# Patient Record
Sex: Male | Born: 1990 | Race: White | Hispanic: No | Marital: Single
Health system: Midwestern US, Community
[De-identification: ages and names within clinical notes are randomized; demographics above are authoritative.]

---

## 2014-06-28 ENCOUNTER — Emergency Department (HOSPITAL_COMMUNITY)
Admission: EM | Admit: 2014-06-28 | Discharge: 2014-06-28 | Disposition: A | Discharge status: Home / Self Care | Payer: Self-pay | Attending: Emergency Medicine | Admitting: Emergency Medicine

## 2014-06-28 ENCOUNTER — Encounter (HOSPITAL_COMMUNITY): Payer: Self-pay

## 2014-06-28 ENCOUNTER — Emergency Department (HOSPITAL_COMMUNITY): Payer: Self-pay

## 2014-06-28 DIAGNOSIS — W228XXA Striking against or struck by other objects, initial encounter: Secondary | ICD-10-CM | POA: Insufficient documentation

## 2014-06-28 DIAGNOSIS — F1410 Cocaine abuse, uncomplicated: Secondary | ICD-10-CM | POA: Insufficient documentation

## 2014-06-28 DIAGNOSIS — Z720 Tobacco use: Secondary | ICD-10-CM | POA: Insufficient documentation

## 2014-06-28 DIAGNOSIS — F1210 Cannabis abuse, uncomplicated: Secondary | ICD-10-CM | POA: Insufficient documentation

## 2014-06-28 DIAGNOSIS — Y990 Civilian activity done for income or pay: Secondary | ICD-10-CM | POA: Insufficient documentation

## 2014-06-28 DIAGNOSIS — S60222A Contusion of left hand, initial encounter: Secondary | ICD-10-CM | POA: Insufficient documentation

## 2014-06-28 DIAGNOSIS — F1110 Opioid abuse, uncomplicated: Secondary | ICD-10-CM | POA: Insufficient documentation

## 2014-06-28 DIAGNOSIS — Y9289 Other specified places as the place of occurrence of the external cause: Secondary | ICD-10-CM | POA: Insufficient documentation

## 2014-06-28 DIAGNOSIS — Y9389 Activity, other specified: Secondary | ICD-10-CM | POA: Insufficient documentation

## 2014-06-28 LAB — COMPREHENSIVE METABOLIC PANEL
ALT: 24 U/L (ref 0–53)
ANION GAP: 7 (ref 5–15)
AST: 24 U/L (ref 0–37)
Albumin: 4.5 g/dL (ref 3.5–5.2)
Alkaline Phosphatase: 57 U/L (ref 39–117)
BUN: 13 mg/dL (ref 6–23)
CO2: 24 mmol/L (ref 19–32)
CREATININE: 0.81 mg/dL (ref 0.50–1.35)
Calcium: 9.1 mg/dL (ref 8.4–10.5)
Chloride: 107 mmol/L (ref 96–112)
GFR calc Af Amer: >90 mL/min (ref >90)
GFR calc non Af Amer: >90 mL/min (ref >90)
Glucose, Bld: 97 mg/dL (ref 70–99)
POTASSIUM: 3.8 mmol/L (ref 3.5–5.1)
SODIUM: 138 mmol/L (ref 135–145)
Total Bilirubin: 0.8 mg/dL (ref 0.3–1.2)
Total Protein: 7.8 g/dL (ref 6.0–8.3)

## 2014-06-28 LAB — RAPID URINE DRUG SCREEN, HOSP PERFORMED
AMPHETAMINES: NOT DETECTED
BENZODIAZEPINES: NOT DETECTED
Barbiturates: NOT DETECTED
Cocaine: POSITIVE — AB
Opiates: POSITIVE — AB
TETRAHYDROCANNABINOL: POSITIVE — AB

## 2014-06-28 LAB — CBC
HCT: 41.6 % (ref 39.0–52.0)
Hemoglobin: 14.1 g/dL (ref 13.0–17.0)
MCH: 27.4 pg (ref 26.0–34.0)
MCHC: 33.9 g/dL (ref 30.0–36.0)
MCV: 80.8 fL (ref 78.0–100.0)
Platelets: 270 10*3/uL (ref 150–400)
RBC: 5.15 MIL/uL (ref 4.22–5.81)
RDW: 13.1 % (ref 11.5–15.5)
WBC: 7.1 10*3/uL (ref 4.0–10.5)

## 2014-06-28 LAB — ETHANOL: Alcohol, Ethyl (B): <5 mg/dL (ref 0–9)

## 2014-06-28 NOTE — Discharge Instructions (Signed)
Medically cleared for detox.  Hand Contusion A hand contusion is a deep bruise on your hand area. Contusions are the result of an injury that caused bleeding under the skin. The contusion may turn blue, purple, or yellow. Minor injuries will give you a painless contusion, but more severe contusions may stay painful and swollen for a few weeks. CAUSES  A contusion is usually caused by a blow, trauma, or direct force to an area of the body. SYMPTOMS   Swelling and redness of the injured area.  Discoloration of the injured area.  Tenderness and soreness of the injured area.  Pain. DIAGNOSIS  The diagnosis can be made by taking a history and performing a physical exam. An X-ray, CT scan, or MRI may be needed to determine if there were any associated injuries, such as broken bones (fractures). TREATMENT  Often, the best treatment for a hand contusion is resting, elevating, icing, and applying cold compresses to the injured area. Over-the-counter medicines may also be recommended for pain control. HOME CARE INSTRUCTIONS   Put ice on the injured area.  Put ice in a plastic bag.  Place a towel between your skin and the bag.  Leave the ice on for 15-20 minutes, 03-04 times a day.  Only take over-the-counter or prescription medicines as directed by your caregiver. Your caregiver may recommend avoiding anti-inflammatory medicines (aspirin, ibuprofen, and naproxen) for 48 hours because these medicines may increase bruising.  If told, use an elastic wrap as directed. This can help reduce swelling. You may remove the wrap for sleeping, showering, and bathing. If your fingers become numb, cold, or blue, take the wrap off and reapply it more loosely.  Elevate your hand with pillows to reduce swelling.  Avoid overusing your hand if it is painful. SEEK IMMEDIATE MEDICAL CARE IF:   You have increased redness, swelling, or pain in your hand.  Your swelling or pain is not relieved with  medicines.  You have loss of feeling in your hand or are unable to move your fingers.  Your hand turns cold or blue.  You have pain when you move your fingers.  Your hand becomes warm to the touch.  Your contusion does not improve in 2 days. MAKE SURE YOU:   Understand these instructions.  Will watch your condition.  Will get help right away if you are not doing well or get worse. Document Released: 08/10/2001 Document Revised: 11/13/2011 Document Reviewed: 08/12/2011 [facility] Patient Information 2015 [place], [place]. This information is not intended to replace advice given to you by your health care provider. Make sure you discuss any questions you have with your health care provider.  Opioid Use Disorder Opioid use disorder is a mental disorder. It is the continued nonmedical use of opioids in spite of risks to health and well-being. Misused opioids include the street drug heroin. They also include pain medicines such as morphine, hydrocodone, oxycodone, and fentanyl. Opioids are very addictive. People who misuse opioids get an exaggerated feeling of well-being. Opioid use disorder often disrupts activities at home, work, or school. It may cause mental or physical problems.  A family history of opioid use disorder puts you at higher risk of it. People with opioid use disorder often misuse other drugs or have mental illness such as depression, posttraumatic stress disorder, or antisocial personality disorder. They also are at risk of suicide and death from overdose. SIGNS AND SYMPTOMS  Signs and symptoms of opioid use disorder include:  Use of opioids in larger  amounts or over a longer period than intended.  Unsuccessful attempts to cut down or control opioid use.  A lot of time spent obtaining, using, or recovering from the effects of opioids.  A strong desire or urge to use opioids (craving).  Continued use of opioids in spite of major problems at work, school, or home because  of use.  Continued use of opioids in spite of relationship problems because of use.  Giving up or cutting down on important life activities because of opioid use.  Use of opioids over and over in situations when it is physically hazardous, such as driving a car.  Continued use of opioids in spite of a physical problem that is likely related to use. Physical problems can include:  Severe constipation.  Poor nutrition.  Infertility.  Tuberculosis.  Aspiration pneumonia.  Infections such as human immunodeficiency virus (HIV) and hepatitis (from injecting opioids).  Continued use of opioids in spite of a mental problem that is likely related to use. Mental problems can include:  Depression.  Anxiety.  Hallucinations.  Sleep problems.  Loss of sexual function.  Need to use more and more opioids to get the same effect, or lessened effect over time with use of the same amount (tolerance).  Having withdrawal symptoms when opioid use is stopped, or using opioids to reduce or avoid withdrawal symptoms. Withdrawal symptoms include:  Depressed, anxious, or irritable mood.  Nausea, vomiting, diarrhea, or intestinal cramping.  Muscle aches or spasms.  Excessive tearing or runny nose.  Dilated pupils, sweating, or hairs standing on end.  Yawning.  Fever, raised blood pressure, or fast pulse.  Restlessness or trouble sleeping. This does not apply to people taking opioids for medical reasons only. DIAGNOSIS Opioid use disorder is diagnosed by your health care provider. You may be asked questions about your opioid use and and how it affects your life. A physical exam may be done. A drug screen may be ordered. You may be referred to a mental health professional. The diagnosis of opioid use disorder requires at least two symptoms within 12 months. The type of opioid use disorder you have depends on the number of signs and symptoms you have. The type may be:  Mild. Two or three  signs and symptoms.   Moderate. Four or five signs and symptoms.   Severe. Six or more signs and symptoms. TREATMENT  Treatment is usually provided by mental health professionals with training in substance use disorders.The following options are available:  Detoxification.This is the first step in treatment for withdrawal. It is medically supervised withdrawal with the use of medicines. These medicines lessen withdrawal symptoms. They also raise the chance of becoming opioid free.  Counseling, also known as talk therapy. Talk therapy addresses the reasons you use opioids. It also addresses ways to keep you from using again (relapse). The goals of talk therapy are to avoid relapse by:  Identifying and avoiding triggers for use.  Finding healthy ways to cope with stress.  Learning how to handle cravings.  Support groups. Support groups provide emotional support, advice, and guidance.  A medicine that blocks opioid receptors in your brain. This medicine can reduce opioid cravings that lead to relapse. This medicine also blocks the desired opioid effect when relapse occurs.  Opioids that are taken by mouth in place of the misused opioid (opioid maintenance treatment). These medicines satisfy cravings but are safer than commonly misused opioids. This often is the best option for people who continue to relapse with  other treatments. HOME CARE INSTRUCTIONS   Take medicines only as directed by your health care provider.  Check with your health care provider before starting new medicines.  Keep all follow-up visits as directed by your health care provider. SEEK MEDICAL CARE IF:  You are not able to take your medicines as directed.  Your symptoms get worse. SEEK IMMEDIATE MEDICAL CARE IF:  You have serious thoughts about hurting yourself or others.  You may have taken an overdose of opioids. FOR MORE INFORMATION  National Institute on Drug Abuse: http://www.price-smith.com/www.drugabuse.gov  Substance  Abuse and Mental Health Services Administration: [employer].com.ptwww.samhsa.gov Document Released: 12/16/2006 Document Revised: 07/05/2013 Document Reviewed: 03/03/2013 [facility] Patient Information 2015 [place]. This information is not intended to replace advice given to you by your health care provider. Make sure you discuss any questions you have with your health care provider.

## 2014-06-28 NOTE — ED Notes (Signed)
Pt ambulating independently w/ steady gait on d/c in no acute distress, A&Ox4. D/c instructions reviewed w/ pt - pt denies any further questions or concerns at present.  

## 2014-06-28 NOTE — ED Provider Notes (Signed)
CSN: [id number]     Arrival date & time 06/28/14  1637 History   This chart was scribed for non-physician practitioner, Celene Skeenobyn Quantel Mcinturff, PA-C, working with Mancel BaleElliott Wentz, MD by Charline BillsEssence Howell, ED Scribe. This patient was seen in room WTR3/WLPT3 and the patient's care was started at 5:09 PM.   Chief Complaint  Patient presents with  . Detox    The history is provided by the patient. No language interpreter was used.   HPI Comments: Jonathan Rocha is a 24 y.o. male who presents to the Emergency Department for opiate detox. Pt has been using opiates and cocaine for the past 1-2 years. His last use was last night. [facility] and was sent here for medical clearance.   Pt also presents with L hand pain since yesterday. He states that he hit his hand while at work yesterday. Pt reports a small abrasion to the area and associated redness.   History reviewed. No pertinent past medical history. History reviewed. No pertinent past surgical history. History reviewed. No pertinent family history. History  Substance Use Topics  . Smoking status: Current Every Day Smoker -- 0.50 packs/day    Types: Cigarettes  . Smokeless tobacco: Not on file  . Alcohol Use: No    Review of Systems  Constitutional: Negative.   Respiratory: Negative.   Cardiovascular: Negative.   Musculoskeletal: Positive for arthralgias.  Neurological: Negative.    Allergies  Review of patient's allergies indicates no known allergies.  Home Medications   Prior to Admission medications   Not on File   BP 127/82 mmHg  Pulse 80  Temp(Src) 97.9 F (36.6 C) (Oral)  Resp 16  SpO2 98% Physical Exam  Constitutional: He is oriented to person, place, and time. He appears well-developed and well-nourished. No distress.  HENT:  Head: Normocephalic and atraumatic.  Eyes: Conjunctivae and EOM are normal.  Neck: Normal range of motion. Neck supple.  Cardiovascular: Normal rate, regular  rhythm and normal heart sounds.   Pulmonary/Chest: Effort normal and breath sounds normal.  Musculoskeletal: Normal range of motion.  Left hand TTP over carpal bones with swelling. For range of motion of hand, wrist and digits. +2 radial pulse. Cap refill less than 3 seconds.  Neurological: He is alert and oriented to person, place, and time.  Skin: Skin is warm and dry.  Psychiatric: He has a normal mood and affect. His behavior is normal.  Nursing note and vitals reviewed.  ED Course  Procedures (including critical care time) DIAGNOSTIC STUDIES: Oxygen Saturation is 98% on RA, normal by my interpretation.    COORDINATION OF CARE: 5:11 PM-Discussed treatment plan which includes XR with pt at bedside and pt agreed to plan.   Labs Review Labs Reviewed  CBC  COMPREHENSIVE METABOLIC PANEL  ETHANOL  URINE RAPID DRUG SCREEN (HOSP PERFORMED)   Imaging Review Dg Hand Complete Left  06/28/2014   CLINICAL DATA:  Left hand trauma, injury, metacarpal pain  EXAM: LEFT HAND - COMPLETE 3+ VIEW  COMPARISON:  None.  FINDINGS: Mild left hand soft tissue swelling dorsally over the metacarpals and wrist. Normal alignment. No acute osseous finding or fracture. Preserved joint spaces. No significant arthropathy. No radiopaque foreign body.  IMPRESSION: Dorsal soft tissue swelling.  No other acute osseous finding.   Electronically Signed   By: Judie PetitM.  Shick M.D.   On: 06/28/2014 18:16     EKG Interpretation None      MDM   Final  diagnoses:  Hand contusion, left, initial encounter  Opioid abuse   Nontoxic appearing, NAD. AF VSS. Neurovascularly intact. Hand x-ray with results as stated above. Advised ice and elevation. Already has a place that he has been accepted for detox as stated above. They are requesting medical clearance and will not accept him without labs. Labs obtained, and he is medically cleared. Stable for discharge. Return precautions given. Patient states understanding of treatment care  plan and is agreeable.  I personally performed the services described in this documentation, which was scribed in my presence. The recorded information has been reviewed and is accurate.  Kathrynn Speed, PA-C 06/28/14 1954  Mancel Bale, MD 06/29/14 [phone]

## 2014-06-28 NOTE — ED Notes (Signed)
Pt presents for detox from heroin and cocaine.  Pt reports last use was yesterday.  Sts "I'm entering a 2 year program and they told me that I need to come here and detox."  Denies SI/HIAV.

## 2015-11-01 IMAGING — CR DG HAND COMPLETE 3+V*L*
3 series · 3 of 3 positions shown · non-contrast
Comparison: None.

CLINICAL DATA: Left hand trauma, injury, metacarpal pain

EXAM:
LEFT HAND - COMPLETE 3+ VIEW

[x hand pa left]
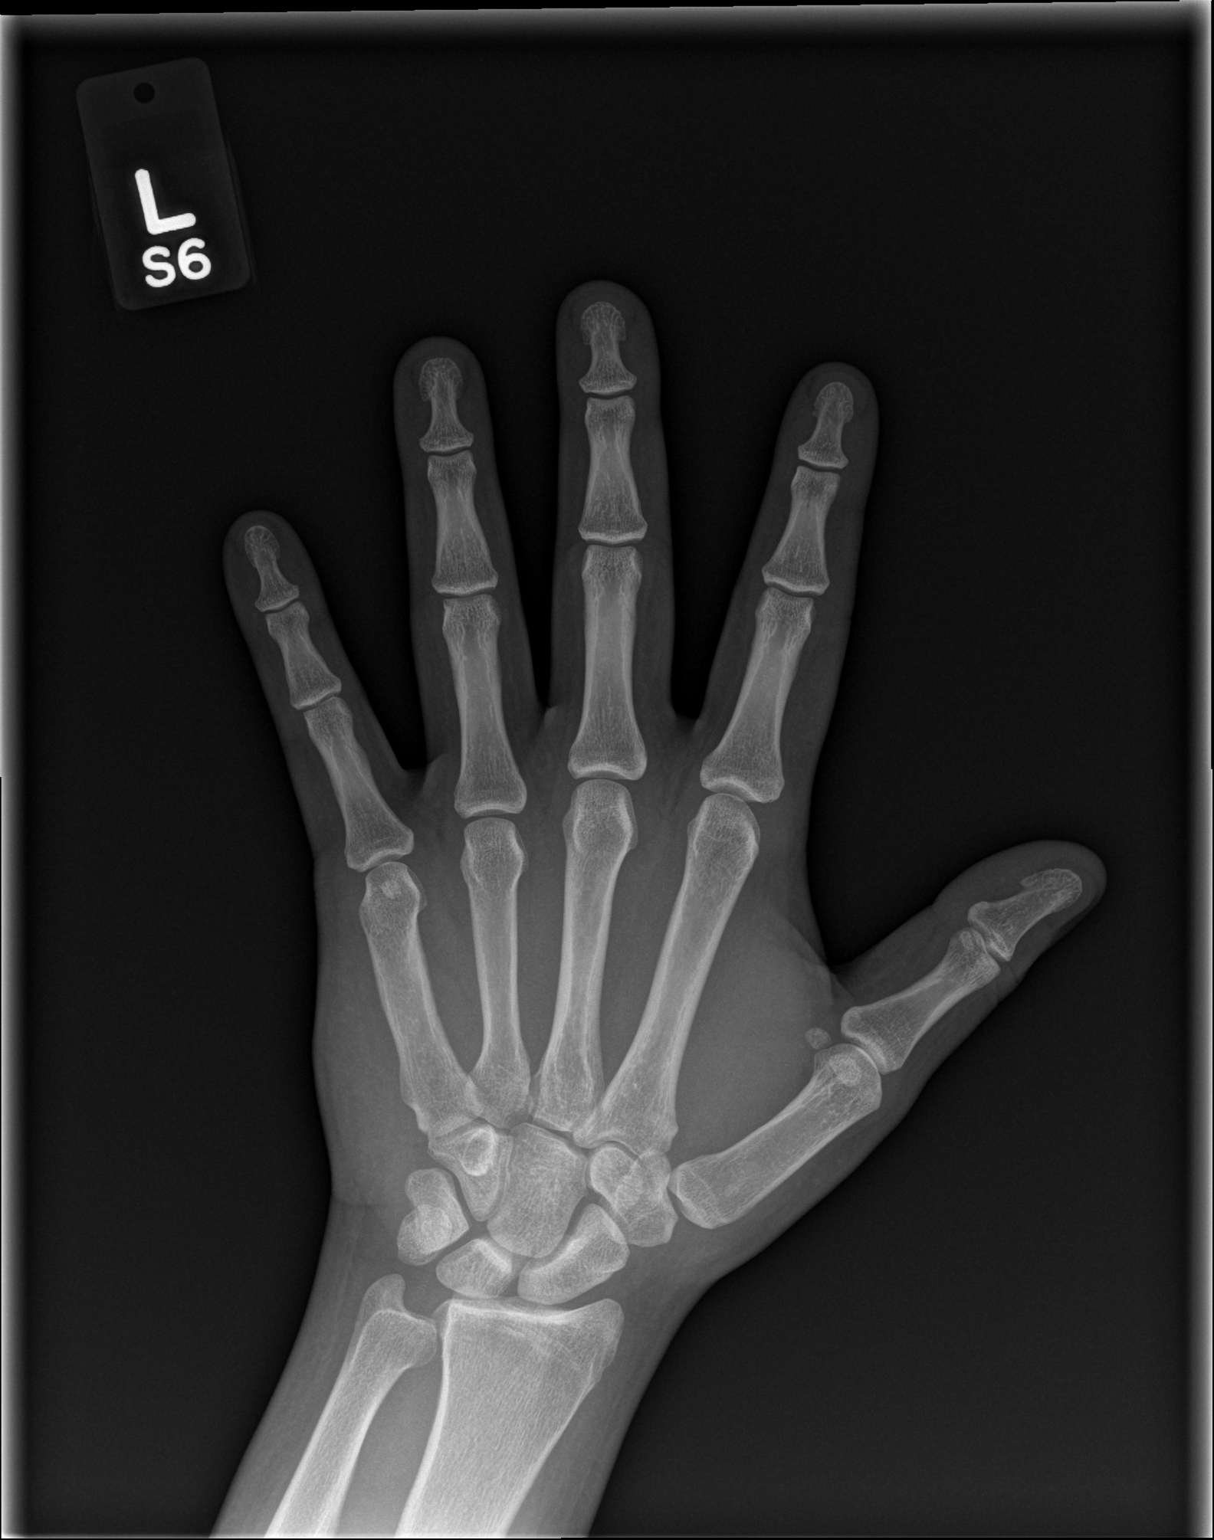

[x hand obl left]
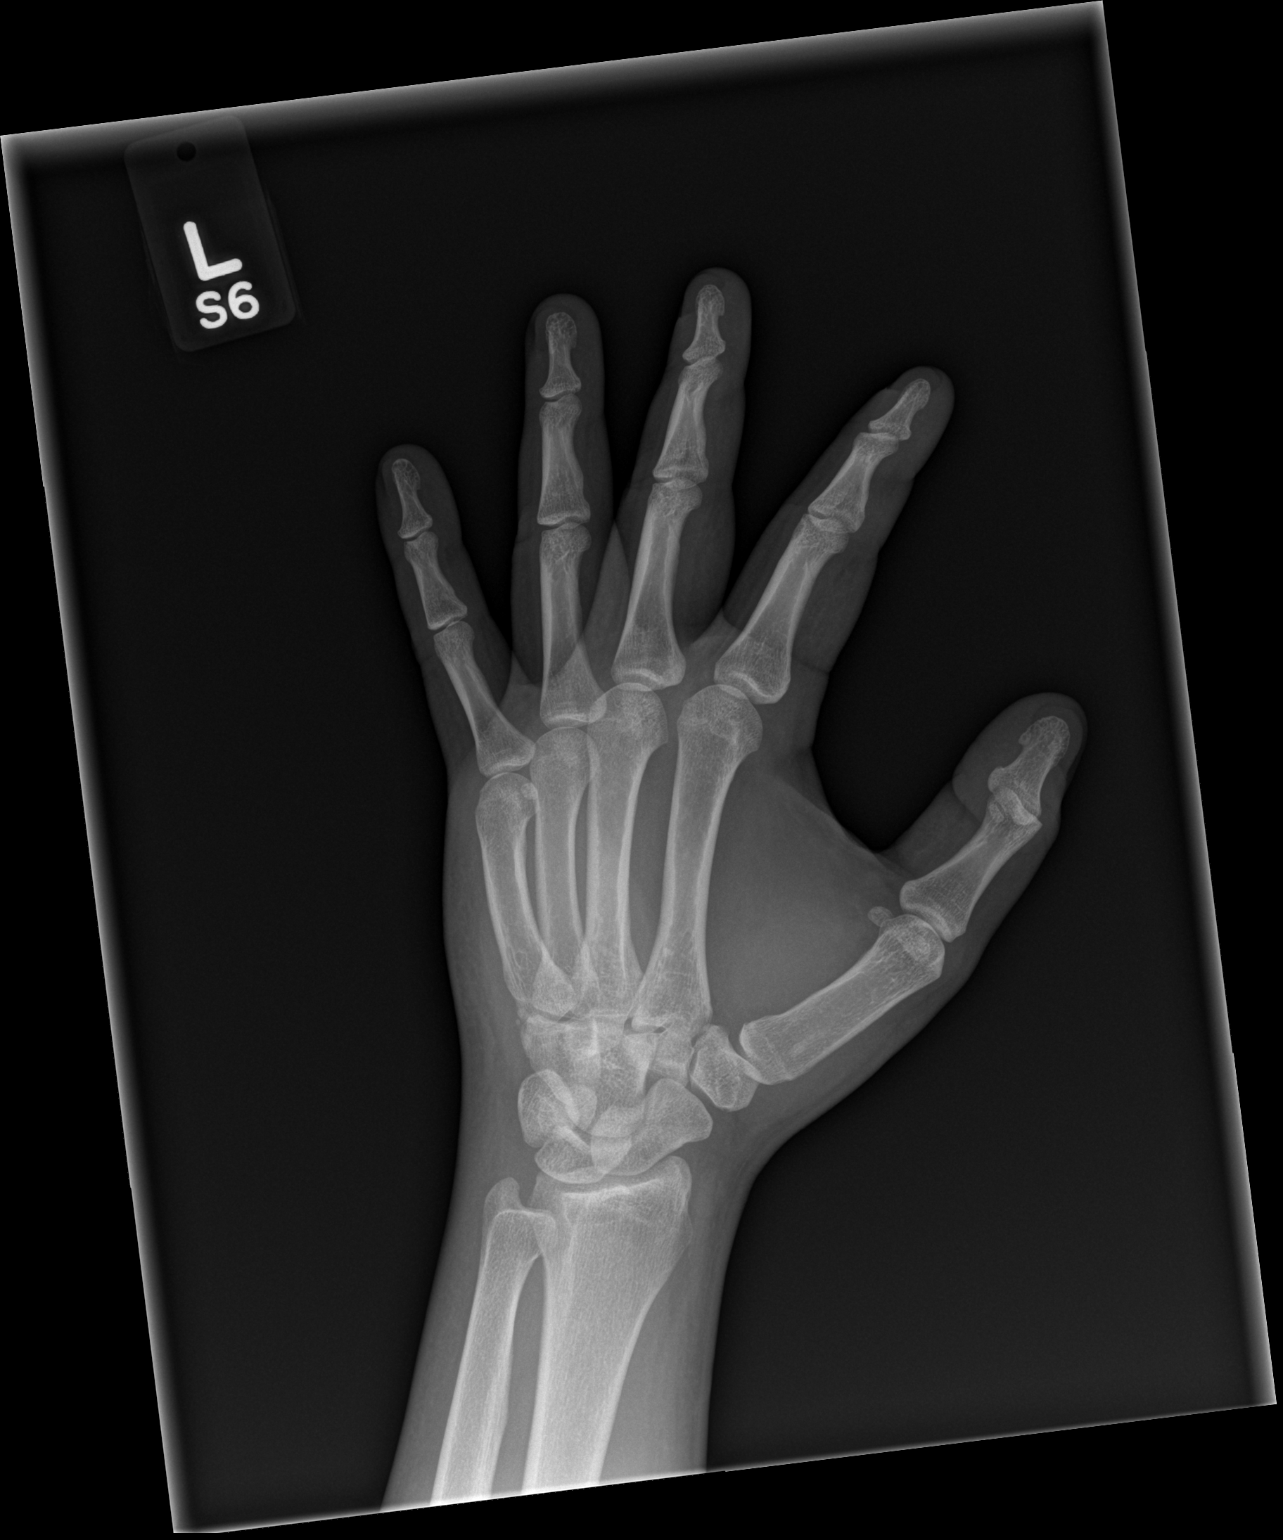

[x hand lat left]
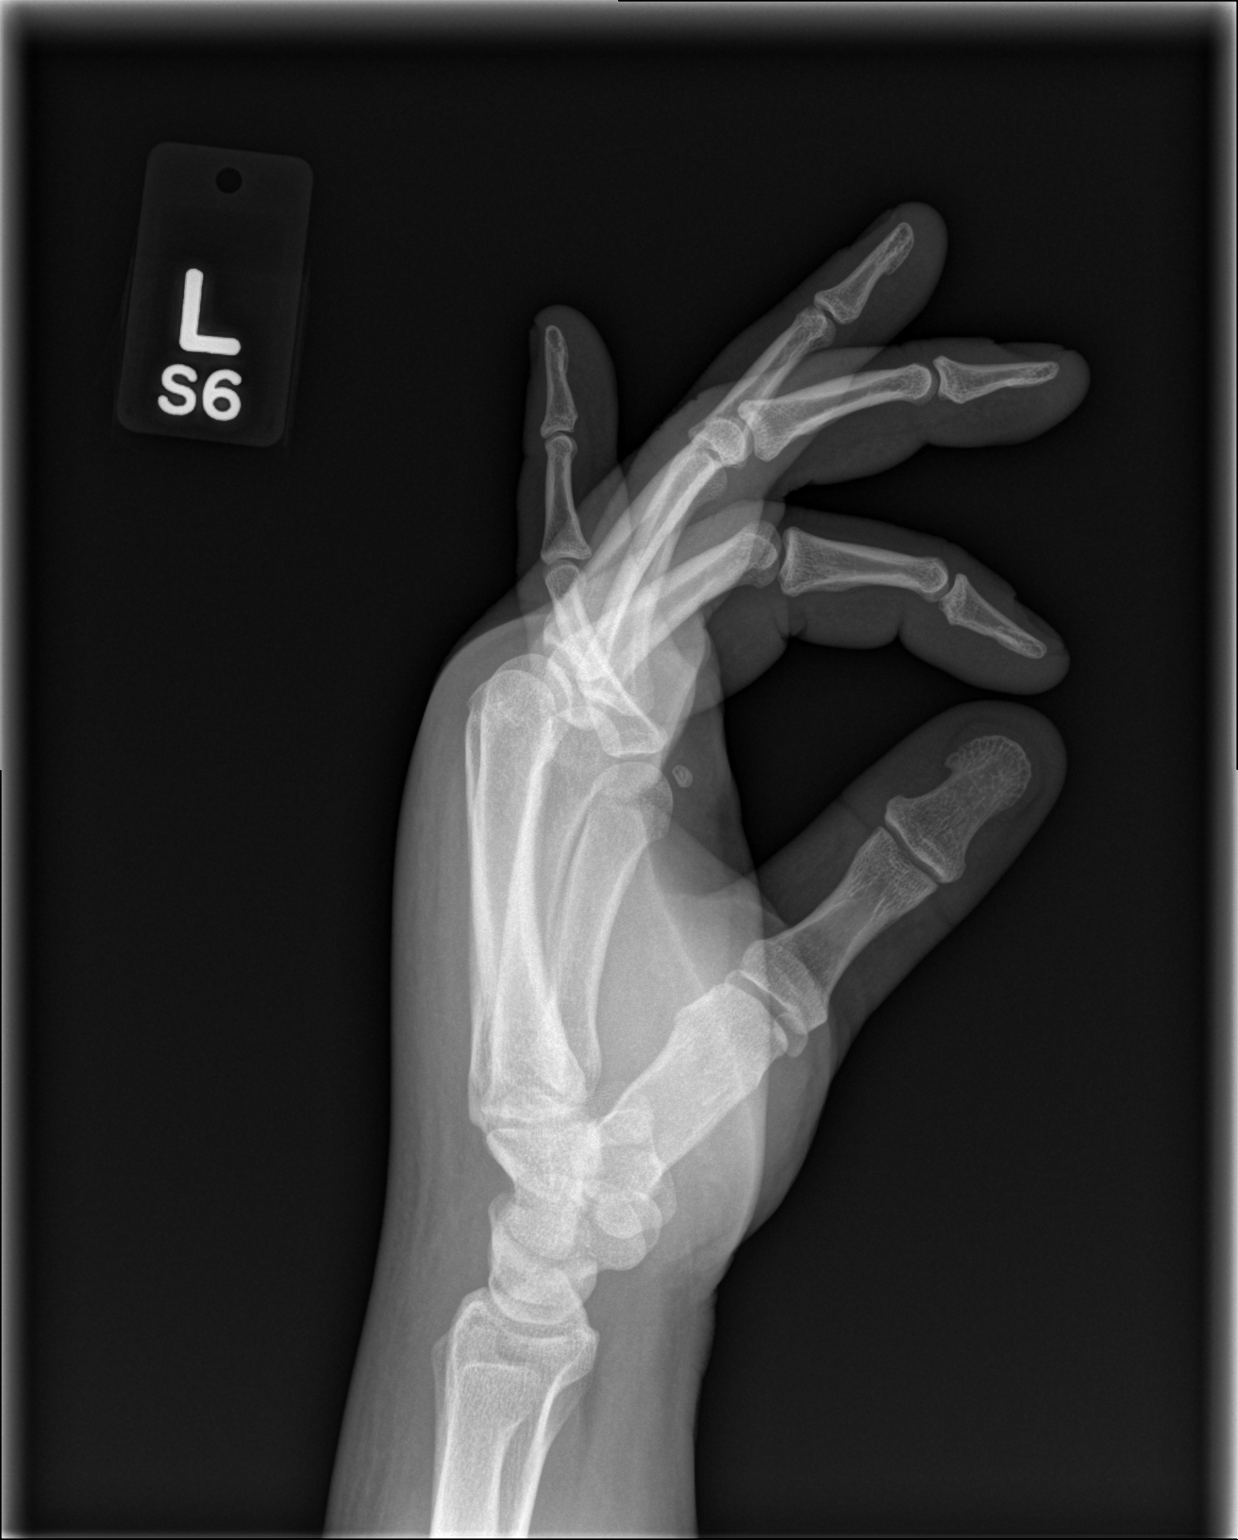

[3 of 3 positions shown; findings below may reference images not displayed]

FINDINGS: Mild left hand soft tissue swelling dorsally over the metacarpals
and wrist. Normal alignment. No acute osseous finding or fracture.
Preserved joint spaces. No significant arthropathy. No radiopaque
foreign body.
IMPRESSION: Dorsal soft tissue swelling.  No other acute osseous finding.

## 2019-09-30 NOTE — ED Provider Notes (Signed)
Formatting of this note is different from the original.  [facility] Emergency Department     No chief complaint on file.    29 yo M who stated that he did some dope tonight and "I guess it messed me up."  EMS was dispatched to the home because patient was found unresponsive in a bathroom.  Narcan 4 mg IN was administered by bystander resulting in the patient being awake when they arrived.  His vital signs were normal but his blood sugar was 240 mg%. The patient stated that he does not know what he snorted.  He is not on any routine medications.     Allergies: Patient has no known allergies.    Current Meds:   No current facility-administered medications for this encounter.     Current Outpatient Medications   Medication Sig Dispense Refill   ? buprenorphine-naloxone (Suboxone) 8-2 mg Sublingual Film Take 0.25 Film under tongue daily.     ? naloxone 4 mg/actuation Nasal Spray, Non-Aerosol by Nasal route as directed. 1 Each 0     Past Medical History:   Diagnosis Date   ? NEGATIVE HISTORY OF      Past Surgical History:   Procedure Laterality Date   ? SUR - OTHER: SPECIFY IN COMMENTS  none     Social History     Tobacco Use   ? Smoking status: Current Every Day Smoker     Packs/day: 0.50     Years: 3.00     Pack years: 1.50     Types: Cigarettes   ? Smokeless tobacco: Never Used   Substance Use Topics   ? Alcohol use: No     Social History     Substance and Sexual Activity   Drug Use No     No family history on file.    Review of Systems   Constitutional: Negative for chills and fever.   HENT: Negative for ear pain and sore throat.         Funny taste in back of throat after narcan   Eyes: Negative for pain and visual disturbance.   Respiratory: Negative for cough and shortness of breath.    Cardiovascular: Negative for chest pain and palpitations.   Gastrointestinal: Negative for abdominal pain, nausea and vomiting.   Genitourinary: Negative for difficulty urinating.   Musculoskeletal: Negative for  arthralgias and back pain.   Skin: Negative for color change and rash.   Neurological: Negative for seizures.   Psychiatric/Behavioral: Negative for self-injury. The patient is nervous/anxious and is hyperactive.    All other systems reviewed and are negative.    Recent VS:     I performed a physical exam    Patient weight not recorded    Physical Exam  Vitals and nursing note reviewed.   Constitutional:       Comments: Wearing home monitoring ankle bracelet   HENT:      Head: Normocephalic and atraumatic.      Nose: Nose normal.      Mouth/Throat:      Mouth: Mucous membranes are dry.      Pharynx: Oropharynx is clear.   Eyes:      Extraocular Movements: Extraocular movements intact.      Conjunctiva/sclera: Conjunctivae normal.      Pupils: Pupils are equal, round, and reactive to light.   Cardiovascular:      Rate and Rhythm: Normal rate and regular rhythm.      Pulses: Normal pulses.  Heart sounds: Normal heart sounds.   Pulmonary:      Effort: Pulmonary effort is normal.      Breath sounds: Normal breath sounds.   Abdominal:      General: Bowel sounds are normal. There is no distension.      Palpations: Abdomen is soft.      Tenderness: There is no abdominal tenderness. There is no guarding.   Musculoskeletal:         General: No swelling or tenderness. Normal range of motion.      Cervical back: Normal range of motion and neck supple.      Right lower leg: No edema.      Left lower leg: No edema.   Skin:     General: Skin is warm and dry.   Neurological:      General: No focal deficit present.      Mental Status: He is oriented to person, place, and time.   Psychiatric:         Attention and Perception: Perception normal.         Mood and Affect: Mood is anxious.         Speech: Speech normal.         Behavior: Behavior is hyperactive.         Cognition and Memory: Cognition and memory normal.         Judgment: Judgment is impulsive.      Comments: Patient "fidgity" and talking over me during examination.        MDM  Number of Diagnoses or Management Options  Diagnosis management comments: 29 yo M presents after apparent opioid overdose which was reversed in the field by narcan IN.  His examination is reassuring.  Will check basic labs considering elevated FSBS by EMS to possibly identify any contributory metabolic abnormality. Patient will be observed in the ED to ensure sobriety     Results for orders placed or performed during the hospital encounter of 09/30/19 (from the past 24 hour(s))   (PANEL)-CBC WITH DIFFERENTIAL *Canceled*    Narrative    The following orders were created for panel order (PANEL)-CBC WITH DIFFERENTIAL.  Procedure                               Abnormality         Status                     ---------                               -----------         ------                       Please view results for these tests on the individual orders.   COMPREHENSIVE METABOLIC PANEL   Test Value Low-High    Sodium 134 (L) 136 - 145 mEq/L    Potassium 4.6 (H) 3.5 - 4.5 mEq/L    Chloride 103 98 - 107 mEq/L    CO2 19 (L) 22 - 29 mEq/L    Calcium 9.2 8.4 - 10.2 mg/dL    Glucose 161 (H) 70 - 105 mg/dL    BUN 13 9 - 21 mg/dL    Creatinine 0.96 0.45 - 1.25 mg/dL    Glomerular Filtration Rate 120 >=59  mL/Min/1.73 m2    Protein, Total 7.5 6.4 - 8.3 g/dL    Albumin 3.9 3.5 - 5.2 g/dL    Bilirubin, Total 1.7 (H) 0.1 - 1.2 mg/dL    Alk Phosphatase 85 40 - 150 U/L    SGOT (AST) 450 (H) 5 - 34 U/L    SGPT (ALT) 446 (H) 0 - 55 U/L    Anion Gap 12 4 - 12 mEq/L    ZOX:WRUEA Ratio 16.46     Osmo (Calc'd) 280 mOsm/kg    Globulin (Calc'd) 3.6 g/dL    A:G Ratio 5.40    LIPASE   Test Value Low-High    Lipase 14 8 - 78 U/L   MAGNESIUM   Test Value Low-High    Magnesium 2.0 1.6 - 2.6 mg/dL   (PANEL)-URINALYSIS, COMPLETE    Narrative    The following orders were created for panel order (PANEL)-URINALYSIS, COMPLETE.  Procedure                               Abnormality         Status                     ---------                                -----------         ------                     URINALYSIS, COMPLETE[[id number]]         Abnormal            Final result                 Please view results for these tests on the individual orders.   DRUG SCREEN, URINE   Test Value Low-High    BARBITURATES SCREEN Below Detection Limit Below Detection Limit    BENZODIAZEPINE SCREEN Below Detection Limit Below Detection Limit    COCAINE SCREEN Above Detection Limit (A) Below Detection Limit    OPIATE SCREEN Below Detection Limit Below Detection Limit    AMPHETAMINE SCREEN Above Detection Limit (A) Below Detection Limit    CANNABINOID SCREEN Below Detection Limit Below Detection Limit    PHENCYCLIDINE SCREEN Below Detection Limit Below Detection Limit    MDMA (ECSTASY) SCREEN Below Detection Limit Below Detection Limit    Methadone Screen Below Detection Limit Below Detection Limit    Narrative    This is an unconfirmed screening result. Cross reactions with other drugs may occur. Consult lab for details. Unconfirmed screening results should not be used for non-medical purposes.    URINALYSIS, COMPLETE   Test Value Low-High    Color, Urine Yellow Colorless, Yellow, or Straw    Clarity, Urine Clear Clear    Specific Gravity, Urine >=1.030 1.000 - 1.050    pH, Urine 5.5 5.0 - 8.0    Hemoglobin, Urine Negative Negative    Bilirubin, Urine Negative Negative    Urobilinogen, Urine Normal Normal    Ketones, Urine Negative Negative    Glucose, Urine Trace (A) Negative    Nitrites, Urine Negative Negative    Leukocyte Esterase, Urine Negative Negative    Protein, Urine 2+ (A) Negative    Narrative    Microscopic examination not indicated.     EKG (Interpretation if  ordered):     MEDICAL DECISION MAKING/RESULTS/PLAN    I have reviewed the past medical, family and social history sections: including the medications, nurses notes, vital signs, and allergies listed in the above record.    Emergency Department Course:     Informed that the patient feels back to his usual and  does not want any further ED evaluation. He does not want to remain pending his lab results.  He has left the ED prior to my speaking with him.                Clinical Impressions as of Sep 29 1217   Opioid overdose, accidental or unintentional, initial encounter (CMS/HCC)     ED Provider: Wyatt Mage, MD    09/30/2019      Electronically signed by Wyatt Mage, MD at 09/30/2019 12:21 PM EDT

## 2020-02-06 DIAGNOSIS — S61210A Laceration without foreign body of right index finger without damage to nail, initial encounter: Secondary | ICD-10-CM

## 2020-02-06 NOTE — ED Notes (Signed)
Patient cut his finger around 8 am with a pocket knife

## 2020-02-07 ENCOUNTER — Inpatient Hospital Stay
Admit: 2020-02-07 | Discharge: 2020-02-07 | Disposition: A | Discharge status: Home / Self Care | Payer: BLUE CROSS/BLUE SHIELD | Attending: Emergency Medicine

## 2020-02-07 MED ORDER — MUPIROCIN 2 % OINTMENT
2 % | Freq: Every day | CUTANEOUS | Status: DC
Start: 2020-02-07 — End: 2020-02-07
  Administered 2020-02-07: 05:00:00 via TOPICAL

## 2020-02-07 MED ORDER — CEPHALEXIN 500 MG CAP
500 mg | ORAL_CAPSULE | Freq: Three times a day (TID) | ORAL | 0 refills | Status: AC
Start: 2020-02-07 — End: 2020-02-12

## 2020-02-07 MED ORDER — TETANUS-DIPHTHERIA TOXOIDS-TD 2 LF UNIT-2 LF UNIT/0.5 ML IM SUSP
2-2 Lf unit/0.5 mL | Freq: Once | INTRAMUSCULAR | Status: AC
Start: 2020-02-07 — End: 2020-02-07
  Administered 2020-02-07: 05:00:00 via INTRAMUSCULAR

## 2020-02-07 MED ORDER — MUPIROCIN 2 % OINTMENT
2 % | Freq: Every day | CUTANEOUS | Status: CN
Start: 2020-02-07 — End: 2020-02-07

## 2020-02-07 MED FILL — TDVAX 2 LF UNIT-2 LF UNIT/0.5 ML INTRAMUSCULAR SUSPENSION: 2-2 Lf unit/0.5 mL | INTRAMUSCULAR | Qty: 0.5

## 2020-02-07 MED FILL — MUPIROCIN 2 % OINTMENT: 2 % | CUTANEOUS | Qty: 22

## 2020-02-07 NOTE — ED Notes (Signed)
I have reviewed discharge instructions with the PATIENT.  The PATIENT  verbalized understanding.Discussed with the patient and all questioned fully answered.

## 2020-02-07 NOTE — ED Provider Notes (Signed)
EMERGENCY DEPARTMENT HISTORY AND PHYSICAL EXAM  ?    Date: 02/06/2020  Patient Name: Ethan Farley    History of Presenting Illness    Patient presents with:  Laceration: right index finger       History Provided By: Patient    HPI: Ethan Farley, 29 y.o. male with no significant past medical history presents to the ED with cc of right index finger laceration with a knife.  Injury was accidental.  It occurred over 12 hours ago.    There are no other complaints, changes, or physical findings at this time.    PCP: None    No current facility-administered medications on file prior to encounter.  Current Outpatient Medications on File Prior to Encounter:  buprenorphine-naloxone (SUBOXONE) 8-2 mg film sublingaul film, 0.25 Film by SubLINGual route daily., Disp: , Rfl:         Past History    Past Medical History:  History reviewed. No pertinent past medical history.    Past Surgical History:  History reviewed. No pertinent surgical history.    Family History:  History reviewed.  No pertinent family history.      Social History:  Social History   Tobacco Use     Smoking status: Current Every Day Smoker       Packs/day: 0.50     Smokeless tobacco: Never Used   Alcohol use: Not Currently   Drug use: Not Currently      Allergies:  No Known Allergies      Review of Systems  @ROSBYAGE @    Physical Exam  @PHYEXAMBYAGE @    Diagnostic Study Results    Labs -  No results found for this or any previous visit (from the past 12 hour(s)).    Radiologic Studies -   No orders to display  CT Results  (Last 48 hours)   None     CXR Results  (Last 48 hours)   None         Medical Decision Making  I am the first provider for this patient.    I reviewed the vital signs, available nursing notes, past medical history, past surgical history, family history and social history.    Vital Signs-Reviewed the patient's vital signs.  Empty flowsheet group.      Records Reviewed: Nursing Notes    Provider Notes (Medical Decision Making):   Seeking  repair wound is delayed.  Hand is also dirty.  Therefore wound was not closed.    ED Course:   Patient was updated on tetanus.  Will cover with Keflex.    Initial assessment performed. The patients presenting problems have been discussed, and they are in agreement with the care plan formulated and outlined with them.  I have encouraged them to ask questions as they arise throughout their visit.              PLAN:  1. Discharge Medication List as of 02/07/2020 12:19 AM      2. Follow-up Information    None    Return to ED if worse     Diagnosis    Clinical Impression: Laceration of right index finger without foreign body without damage to nail, initial encounter  (primary encounter diagnosis)                 History reviewed. No pertinent past medical history.    History reviewed. No pertinent surgical history.      History reviewed. No pertinent family history.  Social History     Socioeconomic History   ??? Marital status: SINGLE     Spouse name: Not on file   ??? Number of children: Not on file   ??? Years of education: Not on file   ??? Highest education level: Not on file   Occupational History   ??? Not on file   Tobacco Use   ??? Smoking status: Current Every Day Smoker     Packs/day: 0.50   ??? Smokeless tobacco: Never Used   Substance and Sexual Activity   ??? Alcohol use: Not Currently   ??? Drug use: Not Currently   ??? Sexual activity: Yes     Partners: Female   Other Topics Concern   ??? Not on file   Social History Narrative   ??? Not on file     Social Determinants of Health     Financial Resource Strain:    ??? Difficulty of Paying Living Expenses: Not on file   Food Insecurity:    ??? Worried About Running Out of Food in the Last Year: Not on file   ??? Ran Out of Food in the Last Year: Not on file   Transportation Needs:    ??? Lack of Transportation (Medical): Not on file   ??? Lack of Transportation (Non-Medical): Not on file   Physical Activity:    ??? Days of Exercise per Week: Not on file   ??? Minutes of Exercise per Session: Not  on file   Stress:    ??? Feeling of Stress : Not on file   Social Connections:    ??? Frequency of Communication with Friends and Family: Not on file   ??? Frequency of Social Gatherings with Friends and Family: Not on file   ??? Attends Religious Services: Not on file   ??? Active Member of Clubs or Organizations: Not on file   ??? Attends Banker Meetings: Not on file   ??? Marital Status: Not on file   Intimate Partner Violence:    ??? Fear of Current or Ex-Partner: Not on file   ??? Emotionally Abused: Not on file   ??? Physically Abused: Not on file   ??? Sexually Abused: Not on file   Housing Stability:    ??? Unable to Pay for Housing in the Last Year: Not on file   ??? Number of Places Lived in the Last Year: Not on file   ??? Unstable Housing in the Last Year: Not on file         ALLERGIES: Patient has no known allergies.    Review of Systems   Constitutional: Negative.    Skin: Positive for wound.   Hematological: Negative.        Vitals:    02/06/20 2312 02/07/20 0020   BP: (!) 144/99 (!) 143/85   Pulse: (!) 102 96   Resp: 18 16   Temp: 98.9 ??F (37.2 ??C)    SpO2: 98% 97%   Weight: 95.7 kg (211 lb)    Height: 5\' 11"  (1.803 m)             Physical Exam  Vitals and nursing note reviewed.   Constitutional:       Appearance: Normal appearance.   HENT:      Head: Normocephalic and atraumatic.   Skin:     Comments: 1 cm laceration on the distal right index finger.  The hand is dirty.   Neurological:      Mental Status:  He is alert.          MDM  Number of Diagnoses or Management Options  Risk of Complications, Morbidity, and/or Mortality  General comments: Wound was left open because of potential contamination and delayed healing.    Patient Progress  Patient progress: stable         Procedures

## 2020-09-18 ENCOUNTER — Emergency Department: Admit: 2020-09-19 | Payer: BLUE CROSS/BLUE SHIELD

## 2020-09-18 DIAGNOSIS — K5900 Constipation, unspecified: Secondary | ICD-10-CM

## 2020-09-18 NOTE — ED Notes (Signed)
Pt presents with constipation. States that he is usually irregular but has not been able to pass a BM for about a  Week. Has tried an OTC stool softener but claims it seems to make things worse. Reoccurring problem and has tried enema and suppositories in the past with no resolution. Pt rates pain at an 8/10 and it is hard to sit down.

## 2020-09-18 NOTE — ED Provider Notes (Signed)
Patient presents with complaint of constipation for 1 week . No relief with stool softeners and enemas. No fever , nausea or vomiting.             Past Medical History:   Diagnosis Date   ??? Ill-defined condition     substance abuse       No past surgical history on file.      No family history on file.    Social History     Socioeconomic History   ??? Marital status: SINGLE     Spouse name: Not on file   ??? Number of children: Not on file   ??? Years of education: Not on file   ??? Highest education level: Not on file   Occupational History   ??? Not on file   Tobacco Use   ??? Smoking status: Current Every Day Smoker     Packs/day: 0.50   ??? Smokeless tobacco: Never Used   Substance and Sexual Activity   ??? Alcohol use: Never   ??? Drug use: Not Currently   ??? Sexual activity: Yes     Partners: Female   Other Topics Concern   ??? Not on file   Social History Narrative   ??? Not on file     Social Determinants of Health     Financial Resource Strain:    ??? Difficulty of Paying Living Expenses: Not on file   Food Insecurity:    ??? Worried About Running Out of Food in the Last Year: Not on file   ??? Ran Out of Food in the Last Year: Not on file   Transportation Needs:    ??? Lack of Transportation (Medical): Not on file   ??? Lack of Transportation (Non-Medical): Not on file   Physical Activity:    ??? Days of Exercise per Week: Not on file   ??? Minutes of Exercise per Session: Not on file   Stress:    ??? Feeling of Stress : Not on file   Social Connections:    ??? Frequency of Communication with Friends and Family: Not on file   ??? Frequency of Social Gatherings with Friends and Family: Not on file   ??? Attends Religious Services: Not on file   ??? Active Member of Clubs or Organizations: Not on file   ??? Attends Banker Meetings: Not on file   ??? Marital Status: Not on file   Intimate Partner Violence:    ??? Fear of Current or Ex-Partner: Not on file   ??? Emotionally Abused: Not on file   ??? Physically Abused: Not on file   ??? Sexually Abused:  Not on file   Housing Stability:    ??? Unable to Pay for Housing in the Last Year: Not on file   ??? Number of Places Lived in the Last Year: Not on file   ??? Unstable Housing in the Last Year: Not on file         ALLERGIES: Patient has no known allergies.    Review of Systems   Constitutional: Negative.    HENT: Negative.    Eyes: Negative.    Respiratory: Negative.    Cardiovascular: Negative.    Gastrointestinal: Positive for abdominal pain and constipation.   Endocrine: Negative.    Genitourinary: Negative.    Musculoskeletal: Negative.    Skin: Negative.    Allergic/Immunologic: Negative.    Neurological: Negative.    Hematological: Negative.    Psychiatric/Behavioral: Negative.  Vitals:    09/18/20 2204   BP: (!) 159/90   Pulse: 98   Resp: 13   Temp: 97.8 ??F (36.6 ??C)   SpO2: 100%   Weight: 94.5 kg (208 lb 4.8 oz)   Height: 5\' 10"  (1.778 m)            Physical Exam  Vitals and nursing note reviewed.   HENT:      Head: Normocephalic and atraumatic.   Cardiovascular:      Rate and Rhythm: Normal rate and regular rhythm.      Pulses: Normal pulses.      Heart sounds: Normal heart sounds.   Pulmonary:      Effort: Pulmonary effort is normal.      Breath sounds: Normal breath sounds.   Abdominal:      General: Abdomen is flat. Bowel sounds are normal.      Palpations: Abdomen is soft.      Tenderness: There is abdominal tenderness. There is no guarding or rebound.   Musculoskeletal:         General: Normal range of motion.      Cervical back: Normal range of motion and neck supple.   Skin:     General: Skin is warm and dry.   Neurological:      General: No focal deficit present.      Mental Status: He is oriented to person, place, and time. Mental status is at baseline.   Psychiatric:         Mood and Affect: Mood normal.         Behavior: Behavior normal.          MDM       Procedures

## 2020-09-18 NOTE — ED Notes (Signed)
Pt given 1/3 of a soap suds enema before stating he couldn't hold it any longer. Pt told to call nurse once he was finished using the bathroom and we could finish the rest of the enema. Pt came out of his room after stating  "I just want to go home and finish. im done." dr Sherilyn Cooter notified

## 2020-09-18 NOTE — ED Notes (Deleted)
Pt described the OTC stool softener that he believes has only made the situation. When asked what the medication was, pt stated he did not know  What it was, but it was a red gel tab that he takes once a day. The writer believes the medication is likely docusate (Colace).

## 2020-09-19 ENCOUNTER — Inpatient Hospital Stay
Admit: 2020-09-19 | Discharge: 2020-09-19 | Disposition: A | Discharge status: Home / Self Care | Payer: BLUE CROSS/BLUE SHIELD | Attending: Emergency Medicine

## 2022-08-14 DIAGNOSIS — L03116 Cellulitis of left lower limb: Secondary | ICD-10-CM

## 2022-08-14 DIAGNOSIS — L03119 Cellulitis of unspecified part of limb: Secondary | ICD-10-CM

## 2022-08-14 NOTE — ED Provider Notes (Signed)
[facility] EMERGENCY DEPT  EMERGENCY DEPARTMENT HISTORY AND PHYSICAL EXAM      Date: 08/14/2022  Patient Name: Ethan Farley  MRN: [id number]  Birthdate April 08, 1990  Date of evaluation: 08/14/2022  Provider: Clarene Critchley, DO   Note Started: 10:31 PM EDT 08/14/22    HISTORY OF PRESENT ILLNESS     Chief Complaint   Patient presents with    Leg Pain       History Provided By: Patient    HPI: Ethan Farley is a 32 y.o. 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  States that he was working on his feet all day yesterday and having to wear socks for long periods of time and thereafter noticed redness to his bilateral shins.  Denies any fever or vomiting.  Denies any injury or trauma.  Denies any chest pain or shortness of breath.  Denies any leg swelling.  No other symptoms complaints.    PAST MEDICAL HISTORY   Past Medical History:  Past Medical History:   Diagnosis Date    Ill-defined condition     substance abuse       Past Surgical History:  History reviewed. No pertinent surgical history.    Family History:  History reviewed. No pertinent family history.    Social History:  Social History     Tobacco Use    Smoking status: Every Day     Current packs/day: 0.50     Types: Cigarettes    Smokeless tobacco: Never   Substance Use Topics    Alcohol use: Never    Drug use: Not Currently       Allergies:  No Known Allergies    PCP: No primary care provider on file.    Current Meds:   Current Facility-Administered Medications   Medication Dose Route Frequency Provider Last Rate Last Admin    cephALEXin (KEFLEX) capsule 500 mg  500 mg Oral NOW Clarene Critchley, DO         Current Outpatient Medications   Medication Sig Dispense Refill    cephALEXin (KEFLEX) 500 MG capsule Take 1 capsule by mouth 2 times daily for 7 days 14 capsule 0    hydrocortisone (V-R HYDROCORTISONE/ALOE) 0.5 % ointment Apply topically 2 times daily. 1  each 0    buprenorphine-naloxone (SUBOXONE) 8-2 MG FILM SL film Place 0.25 Film under the tongue daily.         Social Determinants of Health:   Social Determinants of Health     Tobacco Use: High Risk (08/14/2022)    Patient History     Smoking Tobacco Use: Every Day     Smokeless Tobacco Use: Never     Passive Exposure: Not on file   Alcohol Use: Not on file   Financial Resource Strain: Not on file   Food Insecurity: Not on file   Transportation Needs: Not on file   Physical Activity: Not on file   Stress: Not on file   Social Connections: Not on file   Intimate Partner Violence: Not on file   Depression: Not on file   Housing Stability: Not on file   Interpersonal Safety: Not on file   Utilities: Not on file       PHYSICAL EXAM   Physical Exam  Constitutional:       Appearance: Normal appearance.   HENT:      Head: Normocephalic and atraumatic.  Right Ear: External ear normal.      Left Ear: External ear normal.      Nose: Nose normal. No rhinorrhea.      Mouth/Throat:      Mouth: Mucous membranes are dry.      Pharynx: Oropharynx is clear.   Eyes:      Extraocular Movements: Extraocular movements intact.      Conjunctiva/sclera: Conjunctivae normal.   Cardiovascular:      Rate and Rhythm: Normal rate and regular rhythm.      Pulses: Normal pulses.      Heart sounds: Normal heart sounds.   Pulmonary:      Effort: Pulmonary effort is normal.      Breath sounds: Normal breath sounds.   Abdominal:      General: Abdomen is flat.      Palpations: Abdomen is soft.   Musculoskeletal:         General: No deformity or signs of injury.      Cervical back: Normal range of motion and neck supple.   Skin:     General: Skin is warm and dry.      Comments: Erythema to bilateral anterior shins, consistent with cellulitis, no area of induration fluctuance   Neurological:      General: No focal deficit present.      Mental Status: He is alert and oriented to person, place, and time.   Psychiatric:         Mood and Affect: Mood  normal.         Behavior: Behavior normal.           SCREENINGS                No data recorded    LAB, EKG AND DIAGNOSTIC RESULTS   Labs:  No results found for this or any previous visit (from the past 12 hour(s)).    EKG:.Not Applicable    Radiologic Studies:  Non-plain film images such as CT, Ultrasound and MRI are read by the radiologist. Plain radiographic images are visualized and preliminarily interpreted by the ED Provider with the following findings: Not Applicable.    Interpretation per the Radiologist below, if available at the time of this note:  No orders to display        ED COURSE and DIFFERENTIAL DIAGNOSIS/MDM   10:31 PM Differential and Considerations:     32 year old male with no significant Past Medical History presents emerged department due to bilateral lower extremity cellulitis likely from wearing workloads for long periods of time.  Denies any systemic symptoms.  Denies any injury or trauma.  No concern for DVT at this time, patient without risk factors.  Patient is otherwise well-appearing, vital signs stable, afebrile here.  Will provide Keflex.  Advised to follow-up primary care and explained strict return precautions.    Records Reviewed (source and summary of external notes): Prior medical records and Nursing notes.    Vitals:    Vitals:    08/14/22 2201   BP: 124/72   Pulse: 89   Resp: 18   Temp: 97.7 F (36.5 C)   TempSrc: Oral   SpO2: 100%   Weight: 107.1 kg (236 lb 1.6 oz)   Height: 1.803 m (5\' 11" )        ED COURSE       SEPSIS Reassessment: Sepsis reassessment not applicable    Clinical Management Tools:  Not Applicable    Patient was given the following medications:  Medications   cephALEXin (KEFLEX) capsule 500 mg (has no administration in time range)       CONSULTS: See ED Course/MDM for further details.  None     Social Determinants affecting Diagnosis/Treatment: None    Smoking Cessation: Smoking Cessation Counseling  Discussed the risks of smoking tobacco products and the  long term sequelae of tobacco use with the patient such as increased risk of heart attack, stroke, peripheral artery disease and cancer. The patient verbalized their understanding and were provided resources if asked. Counseled patient for approximately 3 minutes.     PROCEDURES   Unless otherwise noted above, none  Procedures      CRITICAL CARE TIME   Patient does not meet Critical Care Time, 0 minutes    ED IMPRESSION     1. Cellulitis of lower extremity, unspecified laterality          DISPOSITION/PLAN   DISPOSITION Decision To Discharge 08/14/2022 10:08:57 PM    Discharge Note: The patient is stable for discharge home. The signs, symptoms, diagnosis, and discharge instructions have been discussed, understanding conveyed, and agreed upon. The patient is to follow up as recommended or return to ER should their symptoms worsen.      PATIENT REFERRED TO:  [facility] EMERGENCY DEPT  [address]  [place] [address]  [phone]    If symptoms worsen    [facility]  [address]  [place] [address]  [phone]  In 1 week          DISCHARGE MEDICATIONS:     Medication List        START taking these medications      cephALEXin 500 MG capsule  Commonly known as: KEFLEX  Take 1 capsule by mouth 2 times daily for 7 days     hydrocortisone 0.5 % ointment  Commonly known as: V-R HYDROCORTISONE/ALOE  Apply topically 2 times daily.            ASK your doctor about these medications      buprenorphine-naloxone 8-2 MG Film SL film  Commonly known as: SUBOXONE               Where to Get Your Medications        These medications were sent to [facility], [place] - [address] - [place] [phone] Carmon Ginsberg [phone]  [address] [place] [address]      Phone: [phone]   cephALEXin 500 MG capsule  hydrocortisone 0.5 % ointment           DISCONTINUED MEDICATIONS:  Current Discharge Medication List          I  am the Primary Clinician of Record. Clarene Critchley, DO (electronically signed)    (Please note that parts of this dictation were completed with voice recognition software. Quite often unanticipated grammatical, syntax, homophones, and other interpretive errors are inadvertently transcribed by the computer software. Please disregards these errors. Please excuse any errors that have escaped final proofreading.)     Clarene Critchley, DO  08/14/22 2233

## 2022-08-14 NOTE — Discharge Instructions (Signed)
Thank you for choosing our Emergency Department for your care.  It is our privilege to care for you in your time of need.  In the next several days, you may receive a survey via email or mailed to your home about your experience with our team.  We would greatly appreciate you taking a few minutes to complete the survey, as we use this information to learn what we have done well and what we could be doing better. Thank you for trusting us with your care!    Below you will find a list of your tests from today's visit.   Labs  No results found for this or any previous visit (from the past 12 hour(s)).    Radiologic Studies  No orders to display     ------------------------------------------------------------------------------------------------------------  The evaluation and treatment you received in the Emergency Department were for an urgent problem. It is important that you follow-up with a doctor, nurse practitioner, or physician assistant to:  (1) confirm your diagnosis,  (2) re-evaluation of changes in your illness and treatment, and (3) for ongoing care. Please take your discharge instructions with you when you go to your follow-up appointment.     If you have any problem arranging a follow-up appointment, contact us!  If your symptoms become worse or you do not improve as expected, please return to us. We are available 24 hours a day.     If a prescription has been provided, please fill it as soon as possible to prevent a delay in treatment. If you have any questions or reservations about taking the medication due to side effects or interactions with other medications, please call your primary care provider or contact us directly.  Again, THANK YOU for choosing us to care for YOU!

## 2022-08-14 NOTE — ED Triage Notes (Signed)
Pt reports redness and burning to bilateral lower extremities.  Pt states burning sensation started after work this morning around 0400.  + redness and warmth noted to area.

## 2022-08-15 ENCOUNTER — Inpatient Hospital Stay
Admit: 2022-08-15 | Discharge: 2022-08-15 | Disposition: A | Discharge status: Home / Self Care | Payer: BLUE CROSS/BLUE SHIELD | Attending: Student in an Organized Health Care Education/Training Program

## 2022-08-15 MED ORDER — CEPHALEXIN 500 MG PO CAPS
500 MG | ORAL_CAPSULE | Freq: Two times a day (BID) | ORAL | 0 refills | Status: AC
Start: 2022-08-15 — End: 2022-08-21

## 2022-08-15 MED ORDER — CEPHALEXIN 250 MG PO CAPS
250 | ORAL | Status: AC
Start: 2022-08-15 — End: 2022-08-14
  Administered 2022-08-15: 03:00:00 500 mg via ORAL

## 2022-08-15 MED ORDER — HYDROCORTISONE 0.5 % EX OINT
0.5 % | CUTANEOUS | 0 refills | Status: AC
Start: 2022-08-15 — End: ?

## 2022-08-15 MED FILL — CEPHALEXIN 250 MG PO CAPS: 250 MG | ORAL | Qty: 2

## 2022-08-25 DIAGNOSIS — U071 COVID-19: Secondary | ICD-10-CM

## 2022-08-25 NOTE — ED Triage Notes (Signed)
Patient presents c/o home COVID test being positive and unsure if he could go to work or not. Patient also states that he was seen for cellulitis of his leg last week. Patient states he is unsure if it has gotten worse of if he got bit by something.

## 2022-08-25 NOTE — ED Provider Notes (Signed)
[facility]  EMERGENCY DEPARTMENT ENCOUNTER NOTE    Date: 08/25/2022  Patient Name: Ethan Farley    History of Presenting Illness     Chief Complaint   Patient presents with    Positive For Covid-19       History obtained from: Patient    HPI: Ethan Farley, 32 y.o. male with a past medical history as listed and reviewed below presents for URI symptoms in the setting of a previously confirmed COVID19 infection on testing. The patient was diagnosed with COVID-19 today.    The patient reports a 2 day history of the following symptoms: Fatigue, Congestion, and Sore Throat.    The patient does not report any Fever, Chills, Cough, Headache, Diarrhea, Nausea, Vomiting, Abdominal Pain, Chest Pain, and Shortness of breath.    The patient also reports her last the left leg.  He was recently diagnosed with cellulitis and was given Keflex with resolution of the redness.  Over the past day, his redness started to recur over the back of the leg.  No fevers or chills.  No abscess or drainage. No trauma.    Medical History   I reviewed the medical, surgical, family, and social history, as well as allergies:    PCP: No primary care provider on file.    Past Medical History:  Past Medical History:   Diagnosis Date    Ill-defined condition     substance abuse     Past Surgical History:  History reviewed. No pertinent surgical history.  Current Outpatient Medications:  Current Outpatient Medications   Medication Instructions    buprenorphine-naloxone (SUBOXONE) 8-2 MG FILM SL film 0.25 Film, SubLINGual, DAILY    guaiFENesin (ROBITUSSIN) 200 mg, Oral, EVERY 6 HOURS PRN    hydrocortisone (V-R HYDROCORTISONE/ALOE) 0.5 % ointment Apply topically 2 times daily.    ibuprofen (ADVIL;MOTRIN) 600 mg, Oral, EVERY 8 HOURS PRN    oxymetazoline (AFRIN) 0.05 % nasal spray 2 sprays, Nasal, 2 TIMES DAILY, DO NOT TAKE MORE THAN 3-4 DAYS TO PREVENT BAD RUNNY NOSE AS SIDE EFFECT.    sulfamethoxazole-trimethoprim  (BACTRIM DS) 800-160 MG per tablet 1 tablet, Oral, 2 TIMES DAILY    Tylenol 650 mg, Oral, EVERY 6 HOURS PRN      Family History:  History reviewed. No pertinent family history.  Social History:  Social History     Tobacco Use    Smoking status: Every Day     Current packs/day: 0.50     Types: Cigarettes    Smokeless tobacco: Never   Substance Use Topics    Alcohol use: Never    Drug use: Not Currently     Allergies:  No Known Allergies    Review of Systems     Positives and pertinent negatives as per HPI, otherwise negative ROS.    Physical Exam and Vital Signs   Vital Signs - Reviewed the patient's vital signs.    Vitals:    08/25/22 2347   BP: (!) 165/97   Pulse: 97   Resp: 18   Temp: 97.8 F (36.6 C)   TempSrc: Oral   SpO2: 97%   Weight: 105.8 kg (233 lb 4.8 oz)   Height: 1.778 m (5\' 10" )     Physical Exam:    GENERAL: awake, alert, cooperative, not in distress  HEENT:  * Pupils equal, EOMI  * Head atraumatic  CV:  * audible heart sounds  * warm and perfused extremities bilaterally  PULMONARY:  Good air movement, no wheezes, no crackles  ABDOMEN: soft, moving in bed and pulls to seated position without grimace or pain  EXTREMITIES/BACK: moving four extremities without limitation  SKIN: noted a small area of erythema in the back of the leg that might represent early cellulitis given the resolution of the same rash last week with Keflex.  Will treat with Bactrim to cover for MRSA.  NEURO:  * Speech clear  * GCS 15    Medical Decision Making     Patient is a 32 y.o. male presenting for URI symptoms consistent with the known COVID-19 infection. Vitals reveal no hypoxia, hypotension or tachypnea and physical exam reveals no significant abnormalities. Based on the history, physical exam, risk factors, and vital signs, differential includes COVID19, superimposed URI, sinusitis, postnasal drip, cellulitis.     Patient is well appearing, not hypoxic, and is not in respiratory distress. Due to normal exam and the absence  of hypoxia, there is no concern for significant pneumonia. Patient does not meet criteria for admission. Will discharge with symptomatic treatment, quarantine and follow up instructions.     As the patient appears generally well, non-toxic with a completely reassuring clinical picture and exam, and is able to tolerate PO intake, I feel the patient is a good candidate for an outpatient trial of antipyretics and close observation and quarantine with return precautions.    Records Reviewed: Nursing Notes and Old Medical Records  Social Determinants of health affecting management: None    ED Course and Reassessment     ED Course:          Reassessment:    As the patient appears generally well, non-toxic with reassuring clinical picture, exam, vital signs, and is able to tolerate PO intake, I feel the patient is a good candidate for an outpatient trial of antipyretics and close observation and quarantine with return precautions.    Understanding was insured that at this time there is no evidence for a more malignant underlying process, but that early in the process of an illness, an emergency department workup can be falsely reassuring.     Routine discharge counseling was given including the fact that any worsening, changing or persistent symptoms should prompt an immediate call or follow up with their primary physician or the emergency department. The importance of appropriate follow up was also discussed. More extensive discharge instructions were given in the patient's discharge paperwork.    After completion of evaluation and discussion of results and diagnoses, all the questions were answered. If required, all follow up appointments and treatments were discussed and explained. Understanding was insured prior to discharge.    Diagnosis     Clinical Impression:   1. COVID-19    2. Left leg cellulitis        Final Disposition     DISPOSITION: DISCHARGED    Patient will be discharged from the Emergency Department in  stable condition. All of the diagnostic tests were reviewed and any questions were answered. Diagnosis, results, follow up if applicable, and return precautions were discussed. I have also put together printed discharge instructions for them that include: 1) educational information regarding their diagnosis, 2) how to care for their diagnosis at home, as well a 3) list of reasons why they would want to return to the ED prior to their follow-up appointment, should their condition change. Any labs or imaging done in the ED will be either printed with the discharge paperwork or available through MyChart.  DISCHARGE PLAN:  1.   Current Discharge Medication List        START taking these medications    Details   sulfamethoxazole-trimethoprim (BACTRIM DS) 800-160 MG per tablet Take 1 tablet by mouth 2 times daily for 7 days  Qty: 14 tablet, Refills: 0      Acetaminophen (TYLENOL) 325 MG CAPS Take 650 mg by mouth every 6 hours as needed (for pain or fever)  Qty: 20 capsule, Refills: 0      ibuprofen (ADVIL;MOTRIN) 600 MG tablet Take 1 tablet by mouth every 8 hours as needed for Pain  Qty: 20 tablet, Refills: 0      guaiFENesin (ROBITUSSIN) 200 MG/10ML LIQD oral solution Take 10 mLs by mouth every 6 hours as needed for Cough  Qty: 180 mL, Refills: 0      oxymetazoline (AFRIN) 0.05 % nasal spray 2 sprays by Nasal route 2 times daily for 3 days DO NOT TAKE MORE THAN 3-4 DAYS TO PREVENT BAD RUNNY NOSE AS SIDE EFFECT.  Qty: 15 mL, Refills: 0           CONTINUE these medications which have NOT CHANGED    Details   hydrocortisone (V-R HYDROCORTISONE/ALOE) 0.5 % ointment Apply topically 2 times daily.  Qty: 1 each, Refills: 0      buprenorphine-naloxone (SUBOXONE) 8-2 MG FILM SL film Place 0.25 Film under the tongue daily.            2. [facility] EMERGENCY DEPT  [address]  [place] [address]  [phone]  Go to   If symptoms worsen, As needed    3.  Return to ED if worse    4.      Medication List        START taking  these medications      guaiFENesin 200 MG/10ML Liqd oral solution  Commonly known as: ROBITUSSIN  Take 10 mLs by mouth every 6 hours as needed for Cough     ibuprofen 600 MG tablet  Commonly known as: ADVIL;MOTRIN  Take 1 tablet by mouth every 8 hours as needed for Pain     oxymetazoline 0.05 % nasal spray  Commonly known as: AFRIN  2 sprays by Nasal route 2 times daily for 3 days DO NOT TAKE MORE THAN 3-4 DAYS TO PREVENT BAD RUNNY NOSE AS SIDE EFFECT.     sulfamethoxazole-trimethoprim 800-160 MG per tablet  Commonly known as: Bactrim DS  Take 1 tablet by mouth 2 times daily for 7 days     Tylenol 325 MG Caps  Generic drug: Acetaminophen  Take 650 mg by mouth every 6 hours as needed (for pain or fever)            ASK your doctor about these medications      buprenorphine-naloxone 8-2 MG Film SL film  Commonly known as: SUBOXONE     hydrocortisone 0.5 % ointment  Commonly known as: V-R HYDROCORTISONE/ALOE  Apply topically 2 times daily.               Where to Get Your Medications        These medications were sent to [facility], [place] - [address] - [place] [phone] Carmon Ginsberg [phone]  [address] [place] [address]      Phone: [phone]   guaiFENesin 200 MG/10ML Liqd oral solution  ibuprofen 600 MG tablet  oxymetazoline 0.05 % nasal spray  sulfamethoxazole-trimethoprim 800-160  MG per tablet  Tylenol 325 MG Caps         Procedures, Critical Care, & Clinical Tools   Performed by: Tressie Ellis, MD  Procedures     Not Applicable     Results, Consults, Medications     Consults:  None   Labs:  No results found for this or any previous visit (from the past 12 hour(s)).  Radiologic Studies:  No orders to display     Medications ordered:  Medications   sulfamethoxazole-trimethoprim (BACTRIM DS;SEPTRA DS) 800-160 MG per tablet 1 tablet (has no administration in time range)       Documentation Comments   - I am the first and primary provider for this patient and am the primary  provider of record.  - Initial assessment performed. The patients presenting problems have been discussed, and the staff are in agreement with the care plan formulated and outlined with them.  I have encouraged them to ask questions as they arise throughout their visit.  - Available medical records, nursing notes, old EKGs, and EMS run sheets (if patient was EMS transported) were reviewed    Please note that this dictation was completed with Dragon, the computer voice recognition software.  Quite often unanticipated grammatical, syntax, homophones, and other interpretive errors are inadvertently transcribed by the computer software.  Please disregard these errors.  Please excuse any errors that have escaped final proofreading.     Tressie Ellis, MD  08/25/22 2354

## 2022-08-25 NOTE — Discharge Instructions (Signed)
Thank you!    Thank you for allowing me to care for you in the emergency department.  I sincerely hope that you are satisfied with your visit today.  It is my goal to provide you with excellent care.    Below you will find a list of your labs and imaging from your visit today if applicable. Should you have any questions regarding these results please do not hesitate to call the emergency department. Please review MyChart for a more detailed result list since the below list may not be comprehensive. Instructions on how to sign up to MyChart should be provided in this packet.    Labs -  No results found for this or any previous visit (from the past 12 hour(s)).    Radiologic Studies -   No orders to display          If you feel that you have not received excellent quality care or timely care, please ask to speak to the nurse manager. Please choose us in the future for your continued health care needs.   ------------------------------------------------------------------------------------------------------------  The exam and treatment you received in the Emergency Department were for an urgent problem and are not intended as complete care. It is important that you follow-up with a doctor, nurse practitioner, or physician assistant to:  (1) confirm your diagnosis,  (2) re-evaluation of changes in your illness and treatment, and  (3) for ongoing care.  If your symptoms become worse or you do not improve as expected and you are unable to reach your usual health care provider, you should return to the Emergency Department. We are available 24 hours a day.     Please take your discharge instructions with you when you go to your follow-up appointment.     If a prescription has been provided, please have it filled as soon as possible to prevent a delay in treatment. Read the entire medication instruction sheet provided to you by the pharmacy. If you have any questions or reservations about taking the medication due to side  effects or interactions with other medications, please call your primary care physician or contact the ER to speak with the charge nurse.     Make an appointment with your family doctor or the physician you were referred to for follow-up of this visit as instructed on your discharge paperwork, as this is a mandatory follow-up. Return to the ER if you are unable to be seen or if you are unable to be seen in a timely manner.    If you have any problem arranging the follow-up visit, contact the Emergency Department immediately.

## 2022-08-26 ENCOUNTER — Inpatient Hospital Stay
Admit: 2022-08-26 | Discharge: 2022-08-26 | Disposition: A | Discharge status: Home / Self Care | Payer: BLUE CROSS/BLUE SHIELD | Attending: Emergency Medicine

## 2022-08-26 MED ORDER — SULFAMETHOXAZOLE-TRIMETHOPRIM 800-160 MG PO TABS
800-160 | ORAL | Status: AC
Start: 2022-08-26 — End: 2022-08-26
  Administered 2022-08-26: 04:00:00 1 via ORAL

## 2022-08-26 MED ORDER — IBUPROFEN 600 MG PO TABS
600 MG | ORAL_TABLET | Freq: Three times a day (TID) | ORAL | 0 refills | Status: AC | PRN
Start: 2022-08-26 — End: ?

## 2022-08-26 MED ORDER — SULFAMETHOXAZOLE-TRIMETHOPRIM 800-160 MG PO TABS
800-160 MG | ORAL_TABLET | Freq: Two times a day (BID) | ORAL | 0 refills | Status: AC
Start: 2022-08-26 — End: 2022-09-01

## 2022-08-26 MED ORDER — OXYMETAZOLINE HCL 0.05 % NA SOLN
0.05 % | Freq: Two times a day (BID) | NASAL | 0 refills | Status: AC
Start: 2022-08-26 — End: 2022-08-28

## 2022-08-26 MED ORDER — GUAIFENESIN 200 MG/10ML PO LIQD
20010 MG/10ML | Freq: Four times a day (QID) | ORAL | 0 refills | Status: AC | PRN
Start: 2022-08-26 — End: ?

## 2022-08-26 MED ORDER — TYLENOL 325 MG PO CAPS
325 MG | ORAL_CAPSULE | Freq: Four times a day (QID) | ORAL | 0 refills | Status: AC | PRN
Start: 2022-08-26 — End: ?

## 2022-08-26 MED FILL — SULFAMETHOXAZOLE-TRIMETHOPRIM 800-160 MG PO TABS: 800-160 MG | ORAL | Qty: 1
